# Patient Record
Sex: Female | Born: 2000 | Race: White | Hispanic: No | Marital: Single | Smoking: Never smoker
Health system: Southern US, Community
[De-identification: ages and names within clinical notes are randomized; demographics above are authoritative.]

---
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
## 2020-02-28 ENCOUNTER — Ambulatory Visit: Payer: No Typology Code available for payment source | Admitting: Pediatrics

## 2020-09-23 ENCOUNTER — Other Ambulatory Visit: Payer: Self-pay | Admitting: Pediatrics

## 2020-09-23 DIAGNOSIS — N926 Irregular menstruation, unspecified: Secondary | ICD-10-CM

## 2020-11-07 ENCOUNTER — Other Ambulatory Visit (HOSPITAL_COMMUNITY): Payer: Self-pay | Admitting: Physician Assistant

## 2020-11-07 MED FILL — DOXYCYCLINE HYCLATE 100 MG: 100 | 10 days supply | Qty: 20 | Fill #0

## 2020-11-19 ENCOUNTER — Other Ambulatory Visit (HOSPITAL_COMMUNITY): Payer: Self-pay | Admitting: Physician Assistant

## 2020-11-19 MED FILL — FLUCONAZOLE 200 MG TABLET: 200 | 14 days supply | Qty: 14 | Fill #0

## 2020-11-19 MED FILL — OMEPRAZOLE 40 MG CPDR: 40 | 30 days supply | Qty: 30 | Fill #0

## 2022-03-15 ENCOUNTER — Other Ambulatory Visit (HOSPITAL_COMMUNITY): Payer: Self-pay

## 2022-03-15 MED ORDER — WEGOVY 0.5 MG/0.5ML ~~LOC~~ SOAJ
0.5000 mg | SUBCUTANEOUS | 4 refills | Status: AC
  Filled 2022-03-15 – 2022-03-19 (×2): qty 2, 28d supply, fill #0
  Filled 2022-04-20: qty 2, 28d supply, fill #1

## 2022-03-19 ENCOUNTER — Other Ambulatory Visit (HOSPITAL_COMMUNITY): Payer: Self-pay

## 2022-04-21 ENCOUNTER — Other Ambulatory Visit (HOSPITAL_COMMUNITY): Payer: Self-pay

## 2022-04-22 ENCOUNTER — Other Ambulatory Visit (HOSPITAL_COMMUNITY): Payer: Self-pay

## 2022-04-22 MED ORDER — WEGOVY 1.7 MG/0.75ML ~~LOC~~ SOAJ
1.7000 mg | SUBCUTANEOUS | 0 refills | Status: AC
  Filled 2022-04-22: qty 3, 28d supply, fill #0

## 2022-04-23 ENCOUNTER — Other Ambulatory Visit (HOSPITAL_COMMUNITY): Payer: Self-pay

## 2022-05-26 ENCOUNTER — Other Ambulatory Visit (HOSPITAL_COMMUNITY): Payer: Self-pay

## 2022-05-26 MED ORDER — WEGOVY 2.4 MG/0.75ML ~~LOC~~ SOAJ
2.4000 mg | SUBCUTANEOUS | 6 refills | Status: DC
  Filled 2022-05-26: qty 3, 28d supply, fill #0
  Filled 2022-06-10 – 2022-06-23 (×2): qty 3, 28d supply, fill #1
  Filled 2022-07-21: qty 3, 28d supply, fill #2
  Filled 2022-08-17: qty 3, 28d supply, fill #3
  Filled 2022-09-12: qty 3, 28d supply, fill #4
  Filled 2022-10-11: qty 3, 28d supply, fill #5
  Filled 2022-11-20 – 2022-11-29 (×3): qty 3, 28d supply, fill #6

## 2022-05-27 ENCOUNTER — Other Ambulatory Visit (HOSPITAL_COMMUNITY): Payer: Self-pay

## 2022-06-10 ENCOUNTER — Other Ambulatory Visit (HOSPITAL_COMMUNITY): Payer: Self-pay

## 2022-06-23 ENCOUNTER — Other Ambulatory Visit (HOSPITAL_COMMUNITY): Payer: Self-pay

## 2022-07-21 ENCOUNTER — Other Ambulatory Visit (HOSPITAL_COMMUNITY): Payer: Self-pay

## 2022-08-17 ENCOUNTER — Other Ambulatory Visit (HOSPITAL_COMMUNITY): Payer: Self-pay

## 2022-09-13 ENCOUNTER — Other Ambulatory Visit (HOSPITAL_COMMUNITY): Payer: Self-pay

## 2022-10-11 ENCOUNTER — Other Ambulatory Visit (HOSPITAL_COMMUNITY): Payer: Self-pay

## 2022-10-12 ENCOUNTER — Other Ambulatory Visit (HOSPITAL_COMMUNITY): Payer: Self-pay

## 2022-10-12 MED ORDER — METFORMIN HCL ER 500 MG PO TB24
500.0000 mg | ORAL_TABLET | Freq: Every day | ORAL | 3 refills | Status: DC
  Filled 2022-10-12 – 2023-04-19 (×2): qty 90, 90d supply, fill #0
  Filled 2023-08-07: qty 90, 90d supply, fill #1

## 2022-10-13 ENCOUNTER — Other Ambulatory Visit (HOSPITAL_COMMUNITY): Payer: Self-pay

## 2022-10-25 ENCOUNTER — Other Ambulatory Visit (HOSPITAL_COMMUNITY): Payer: Self-pay

## 2022-11-01 ENCOUNTER — Other Ambulatory Visit (HOSPITAL_COMMUNITY): Payer: Self-pay

## 2022-11-23 ENCOUNTER — Other Ambulatory Visit (HOSPITAL_COMMUNITY): Payer: Self-pay

## 2022-11-25 ENCOUNTER — Other Ambulatory Visit (HOSPITAL_COMMUNITY): Payer: Self-pay

## 2022-11-29 ENCOUNTER — Other Ambulatory Visit (HOSPITAL_COMMUNITY): Payer: Self-pay

## 2022-11-30 ENCOUNTER — Other Ambulatory Visit (HOSPITAL_COMMUNITY): Payer: Self-pay

## 2022-12-23 ENCOUNTER — Other Ambulatory Visit (HOSPITAL_COMMUNITY): Payer: Self-pay

## 2022-12-23 MED ORDER — SEMAGLUTIDE-WEIGHT MANAGEMENT 2.4 MG/0.75ML ~~LOC~~ SOAJ
2.4000 mg | SUBCUTANEOUS | 6 refills | Status: AC
  Filled 2022-12-23: qty 3, 28d supply, fill #0

## 2023-01-20 ENCOUNTER — Other Ambulatory Visit (HOSPITAL_COMMUNITY): Payer: Self-pay

## 2023-01-20 MED ORDER — WEGOVY 2.4 MG/0.75ML ~~LOC~~ SOAJ
2.4000 mg | SUBCUTANEOUS | 4 refills | Status: AC
  Filled 2023-01-20: qty 9, 84d supply, fill #0

## 2023-03-17 DIAGNOSIS — E7849 Other hyperlipidemia: Secondary | ICD-10-CM | POA: Diagnosis not present

## 2023-03-17 DIAGNOSIS — K209 Esophagitis, unspecified without bleeding: Secondary | ICD-10-CM | POA: Diagnosis not present

## 2023-03-17 DIAGNOSIS — R7989 Other specified abnormal findings of blood chemistry: Secondary | ICD-10-CM | POA: Diagnosis not present

## 2023-03-17 DIAGNOSIS — Z131 Encounter for screening for diabetes mellitus: Secondary | ICD-10-CM | POA: Diagnosis not present

## 2023-03-22 ENCOUNTER — Other Ambulatory Visit (HOSPITAL_COMMUNITY): Payer: Self-pay

## 2023-03-22 DIAGNOSIS — Z6841 Body Mass Index (BMI) 40.0 and over, adult: Secondary | ICD-10-CM | POA: Diagnosis not present

## 2023-03-22 DIAGNOSIS — R030 Elevated blood-pressure reading, without diagnosis of hypertension: Secondary | ICD-10-CM | POA: Diagnosis not present

## 2023-03-22 DIAGNOSIS — E282 Polycystic ovarian syndrome: Secondary | ICD-10-CM | POA: Diagnosis not present

## 2023-03-22 DIAGNOSIS — M25473 Effusion, unspecified ankle: Secondary | ICD-10-CM | POA: Diagnosis not present

## 2023-03-22 DIAGNOSIS — E7849 Other hyperlipidemia: Secondary | ICD-10-CM | POA: Diagnosis not present

## 2023-03-22 DIAGNOSIS — Z23 Encounter for immunization: Secondary | ICD-10-CM | POA: Diagnosis not present

## 2023-03-22 DIAGNOSIS — G43909 Migraine, unspecified, not intractable, without status migrainosus: Secondary | ICD-10-CM | POA: Diagnosis not present

## 2023-03-22 DIAGNOSIS — R7989 Other specified abnormal findings of blood chemistry: Secondary | ICD-10-CM | POA: Diagnosis not present

## 2023-03-22 MED ORDER — TOPIRAMATE 50 MG PO TABS
50.0000 mg | ORAL_TABLET | Freq: Every day | ORAL | 12 refills | Status: DC
  Filled 2023-03-22: qty 30, 30d supply, fill #0
  Filled 2023-04-20: qty 30, 30d supply, fill #1
  Filled 2023-05-29: qty 30, 30d supply, fill #2
  Filled 2023-07-07: qty 30, 30d supply, fill #3
  Filled 2023-08-07: qty 30, 30d supply, fill #4

## 2023-04-19 ENCOUNTER — Other Ambulatory Visit (HOSPITAL_COMMUNITY): Payer: Self-pay

## 2023-05-31 ENCOUNTER — Other Ambulatory Visit: Payer: Self-pay | Admitting: Obstetrics and Gynecology

## 2023-05-31 DIAGNOSIS — R030 Elevated blood-pressure reading, without diagnosis of hypertension: Secondary | ICD-10-CM | POA: Diagnosis not present

## 2023-05-31 DIAGNOSIS — Z30431 Encounter for routine checking of intrauterine contraceptive device: Secondary | ICD-10-CM | POA: Diagnosis not present

## 2023-05-31 DIAGNOSIS — Z01419 Encounter for gynecological examination (general) (routine) without abnormal findings: Secondary | ICD-10-CM | POA: Diagnosis not present

## 2023-05-31 DIAGNOSIS — Z113 Encounter for screening for infections with a predominantly sexual mode of transmission: Secondary | ICD-10-CM | POA: Diagnosis not present

## 2023-05-31 DIAGNOSIS — N6310 Unspecified lump in the right breast, unspecified quadrant: Secondary | ICD-10-CM | POA: Diagnosis not present

## 2023-05-31 DIAGNOSIS — Z1389 Encounter for screening for other disorder: Secondary | ICD-10-CM | POA: Diagnosis not present

## 2023-07-08 ENCOUNTER — Other Ambulatory Visit: Payer: Self-pay | Admitting: Oncology

## 2023-07-08 DIAGNOSIS — Z006 Encounter for examination for normal comparison and control in clinical research program: Secondary | ICD-10-CM

## 2023-07-11 ENCOUNTER — Other Ambulatory Visit (HOSPITAL_COMMUNITY)
Admission: RE | Admit: 2023-07-11 | Discharge: 2023-07-11 | Disposition: A | Discharge status: Home / Self Care | Payer: 59 | Source: Ambulatory Visit | Attending: Oncology | Admitting: Oncology

## 2023-07-11 DIAGNOSIS — Z006 Encounter for examination for normal comparison and control in clinical research program: Secondary | ICD-10-CM | POA: Insufficient documentation

## 2023-07-21 LAB — GENECONNECT MOLECULAR SCREEN: Genetic Analysis Overall Interpretation: NEGATIVE

## 2023-08-09 DIAGNOSIS — R7989 Other specified abnormal findings of blood chemistry: Secondary | ICD-10-CM | POA: Diagnosis not present

## 2023-08-09 DIAGNOSIS — E7849 Other hyperlipidemia: Secondary | ICD-10-CM | POA: Diagnosis not present

## 2023-08-09 DIAGNOSIS — Z131 Encounter for screening for diabetes mellitus: Secondary | ICD-10-CM | POA: Diagnosis not present

## 2023-08-09 DIAGNOSIS — L03115 Cellulitis of right lower limb: Secondary | ICD-10-CM | POA: Diagnosis not present

## 2023-08-22 ENCOUNTER — Other Ambulatory Visit: Payer: 59

## 2023-09-02 ENCOUNTER — Other Ambulatory Visit (HOSPITAL_COMMUNITY): Payer: Self-pay

## 2023-09-05 ENCOUNTER — Other Ambulatory Visit (HOSPITAL_COMMUNITY): Payer: Self-pay

## 2023-09-05 MED ORDER — TOPIRAMATE 100 MG PO TABS
100.0000 mg | ORAL_TABLET | Freq: Every day | ORAL | 11 refills | Status: DC
  Filled 2023-09-05: qty 30, 30d supply, fill #0
  Filled 2023-10-16: qty 30, 30d supply, fill #1
  Filled 2023-11-12: qty 30, 30d supply, fill #2
  Filled 2024-01-15: qty 30, 30d supply, fill #3
  Filled 2024-02-21: qty 30, 30d supply, fill #4
  Filled 2024-03-27: qty 30, 30d supply, fill #5
  Filled 2024-05-21: qty 30, 30d supply, fill #6
  Filled 2024-07-08: qty 30, 30d supply, fill #7
  Filled 2024-08-26: qty 30, 30d supply, fill #8

## 2023-09-15 ENCOUNTER — Other Ambulatory Visit (HOSPITAL_COMMUNITY): Payer: Self-pay

## 2023-11-12 ENCOUNTER — Other Ambulatory Visit (HOSPITAL_COMMUNITY): Payer: Self-pay

## 2023-11-14 ENCOUNTER — Other Ambulatory Visit: Payer: Self-pay

## 2023-11-15 ENCOUNTER — Other Ambulatory Visit (HOSPITAL_COMMUNITY): Payer: Self-pay

## 2023-11-15 ENCOUNTER — Encounter (HOSPITAL_COMMUNITY): Payer: Self-pay

## 2023-11-24 ENCOUNTER — Other Ambulatory Visit (HOSPITAL_COMMUNITY): Payer: Self-pay

## 2023-11-25 ENCOUNTER — Other Ambulatory Visit (HOSPITAL_COMMUNITY): Payer: Self-pay

## 2023-11-28 ENCOUNTER — Other Ambulatory Visit (HOSPITAL_COMMUNITY): Payer: Self-pay

## 2023-12-02 ENCOUNTER — Other Ambulatory Visit (HOSPITAL_COMMUNITY): Payer: Self-pay

## 2023-12-12 ENCOUNTER — Other Ambulatory Visit (HOSPITAL_COMMUNITY): Payer: Self-pay

## 2023-12-12 MED ORDER — METFORMIN HCL ER 500 MG PO TB24
500.0000 mg | ORAL_TABLET | Freq: Every day | ORAL | 3 refills | Status: AC
  Filled 2023-12-12: qty 90, 90d supply, fill #0

## 2023-12-13 ENCOUNTER — Other Ambulatory Visit (HOSPITAL_COMMUNITY): Payer: Self-pay

## 2023-12-14 ENCOUNTER — Other Ambulatory Visit (HOSPITAL_COMMUNITY): Payer: Self-pay

## 2023-12-14 MED ORDER — METFORMIN HCL ER 500 MG PO TB24
500.0000 mg | ORAL_TABLET | Freq: Every day | ORAL | 3 refills | Status: AC
  Filled 2024-02-21 – 2024-02-24 (×2): qty 90, 90d supply, fill #0

## 2024-02-20 DIAGNOSIS — E7849 Other hyperlipidemia: Secondary | ICD-10-CM | POA: Diagnosis not present

## 2024-02-20 DIAGNOSIS — R7989 Other specified abnormal findings of blood chemistry: Secondary | ICD-10-CM | POA: Diagnosis not present

## 2024-02-20 DIAGNOSIS — E782 Mixed hyperlipidemia: Secondary | ICD-10-CM | POA: Diagnosis not present

## 2024-02-20 DIAGNOSIS — D559 Anemia due to enzyme disorder, unspecified: Secondary | ICD-10-CM | POA: Diagnosis not present

## 2024-02-20 DIAGNOSIS — Z131 Encounter for screening for diabetes mellitus: Secondary | ICD-10-CM | POA: Diagnosis not present

## 2024-02-20 DIAGNOSIS — Z13220 Encounter for screening for lipoid disorders: Secondary | ICD-10-CM | POA: Diagnosis not present

## 2024-02-22 ENCOUNTER — Other Ambulatory Visit: Payer: Self-pay

## 2024-02-22 ENCOUNTER — Other Ambulatory Visit (HOSPITAL_COMMUNITY): Payer: Self-pay

## 2024-02-24 ENCOUNTER — Other Ambulatory Visit (HOSPITAL_COMMUNITY): Payer: Self-pay

## 2024-02-28 ENCOUNTER — Other Ambulatory Visit (HOSPITAL_COMMUNITY): Payer: Self-pay

## 2024-02-28 DIAGNOSIS — R7989 Other specified abnormal findings of blood chemistry: Secondary | ICD-10-CM | POA: Diagnosis not present

## 2024-02-28 DIAGNOSIS — E282 Polycystic ovarian syndrome: Secondary | ICD-10-CM | POA: Diagnosis not present

## 2024-02-28 DIAGNOSIS — G43909 Migraine, unspecified, not intractable, without status migrainosus: Secondary | ICD-10-CM | POA: Diagnosis not present

## 2024-02-28 MED ORDER — CETIRIZINE HCL 10 MG PO TABS
10.0000 mg | ORAL_TABLET | Freq: Every day | ORAL | 6 refills | Status: AC
  Filled 2024-02-28: qty 100, 100d supply, fill #0

## 2024-02-28 MED ORDER — METFORMIN HCL ER 500 MG PO TB24
1000.0000 mg | ORAL_TABLET | Freq: Every day | ORAL | 3 refills | Status: AC
  Filled 2024-05-21: qty 180, 90d supply, fill #0
  Filled 2024-08-26: qty 180, 90d supply, fill #1

## 2024-02-28 MED ORDER — ERGOCALCIFEROL 1.25 MG (50000 UT) PO CAPS
50000.0000 [IU] | ORAL_CAPSULE | ORAL | 2 refills | Status: AC
  Filled 2024-02-28: qty 13, 90d supply, fill #0
  Filled 2024-05-21: qty 13, 90d supply, fill #1
  Filled 2024-07-08 – 2024-08-26 (×2): qty 13, 90d supply, fill #2

## 2024-03-01 ENCOUNTER — Telehealth: Admitting: Family Medicine

## 2024-03-01 DIAGNOSIS — H10022 Other mucopurulent conjunctivitis, left eye: Secondary | ICD-10-CM | POA: Diagnosis not present

## 2024-03-01 MED ORDER — POLYMYXIN B-TRIMETHOPRIM 10000-0.1 UNIT/ML-% OP SOLN: 1.0000 [drp] | Freq: Four times a day (QID) | OPHTHALMIC | 0 refills | Status: AC

## 2024-03-01 NOTE — Progress Notes (Signed)
 E-Visit for Pink Eye ? ? ?We are sorry that you are not feeling well.  Here is how we plan to help! ? ?Based on what you have shared with me it looks like you have conjunctivitis.  Conjunctivitis is a common inflammatory or infectious condition of the eye that is often referred to as "pink eye".  In most cases it is contagious (viral or bacterial). However, not all conjunctivitis requires antibiotics (ex. Allergic).  We have made appropriate suggestions for you based upon your presentation. ? ?I have prescribed Polytrim Ophthalmic drops 1-2 drops 4 times a day times 5 days ? ?Pink eye can be highly contagious.  It is typically spread through direct contact with secretions, or contaminated objects or surfaces that one may have touched.  Strict handwashing is suggested with soap and water is urged.  If not available, use alcohol based had sanitizer.  Avoid unnecessary touching of the eye.  If you wear contact lenses, you will need to refrain from wearing them until you see no white discharge from the eye for at least 24 hours after being on medication.  You should see symptom improvement in 1-2 days after starting the medication regimen.  Call us if symptoms are not improved in 1-2 days. ? ?Home Care: ?Wash your hands often! ?Do not wear your contacts until you complete your treatment plan. ?Avoid sharing towels, bed linen, personal items with a person who has pink eye. ?See attention for anyone in your home with similar symptoms. ? ?Get Help Right Away If: ?Your symptoms do not improve. ?You develop blurred or loss of vision. ?Your symptoms worsen (increased discharge, pain or redness) ? ? ?Thank you for choosing an e-visit. ? ?Your e-visit answers were reviewed by a board certified advanced clinical practitioner to complete your personal care plan. Depending upon the condition, your plan could have included both over the counter or prescription medications. ? ?Please review your pharmacy choice. Make sure the  pharmacy is open so you can pick up prescription now. If there is a problem, you may contact your provider through [employer] and have the prescription routed to another pharmacy.  Your safety is important to [place]. If you have drug allergies check your prescription carefully.  ? ?For the next 24 hours you can use MyChart to ask questions about today's visit, request a non-urgent call back, or ask for a work or school excuse. ?You will get an email in the next two days asking about your experience. I hope that your e-visit has been valuable and will speed your recovery. ? ?I provided 5 minutes of non face-to-face time during this encounter for chart review, medication and order placement, as well as and documentation.  ? ?

## 2024-03-02 ENCOUNTER — Other Ambulatory Visit (HOSPITAL_COMMUNITY): Payer: Self-pay

## 2024-03-09 ENCOUNTER — Other Ambulatory Visit (HOSPITAL_COMMUNITY): Payer: Self-pay

## 2024-03-16 ENCOUNTER — Other Ambulatory Visit (HOSPITAL_COMMUNITY): Payer: Self-pay

## 2024-03-22 ENCOUNTER — Other Ambulatory Visit (HOSPITAL_COMMUNITY): Payer: Self-pay

## 2024-03-29 ENCOUNTER — Other Ambulatory Visit (HOSPITAL_COMMUNITY): Payer: Self-pay

## 2024-04-02 ENCOUNTER — Other Ambulatory Visit (HOSPITAL_COMMUNITY): Payer: Self-pay

## 2024-05-22 ENCOUNTER — Other Ambulatory Visit (HOSPITAL_COMMUNITY): Payer: Self-pay

## 2024-06-06 ENCOUNTER — Other Ambulatory Visit: Payer: Self-pay | Admitting: Obstetrics and Gynecology

## 2024-06-06 DIAGNOSIS — N6310 Unspecified lump in the right breast, unspecified quadrant: Secondary | ICD-10-CM

## 2024-06-19 ENCOUNTER — Ambulatory Visit
Admission: RE | Admit: 2024-06-19 | Discharge: 2024-06-19 | Disposition: A | Discharge status: Home / Self Care | Source: Ambulatory Visit | Attending: Obstetrics and Gynecology | Admitting: Obstetrics and Gynecology

## 2024-06-19 DIAGNOSIS — N6310 Unspecified lump in the right breast, unspecified quadrant: Secondary | ICD-10-CM

## 2024-07-09 ENCOUNTER — Other Ambulatory Visit (HOSPITAL_COMMUNITY): Payer: Self-pay

## 2024-09-03 ENCOUNTER — Other Ambulatory Visit (HOSPITAL_COMMUNITY): Payer: Self-pay

## 2024-09-03 MED ORDER — METFORMIN HCL 500 MG PO TABS
500.0000 mg | ORAL_TABLET | Freq: Every day | ORAL | 2 refills | Status: AC
  Filled 2024-09-03 (×2): qty 30, 30d supply, fill #0

## 2024-09-03 MED ORDER — HYDROXYZINE HCL 10 MG PO TABS
10.0000 mg | ORAL_TABLET | Freq: Two times a day (BID) | ORAL | 0 refills | Status: DC | PRN
  Filled 2024-09-03 (×2): qty 60, 30d supply, fill #0

## 2024-09-04 ENCOUNTER — Other Ambulatory Visit: Payer: Self-pay

## 2024-09-30 ENCOUNTER — Other Ambulatory Visit (HOSPITAL_BASED_OUTPATIENT_CLINIC_OR_DEPARTMENT_OTHER): Payer: Self-pay

## 2024-10-01 ENCOUNTER — Other Ambulatory Visit (HOSPITAL_BASED_OUTPATIENT_CLINIC_OR_DEPARTMENT_OTHER): Payer: Self-pay

## 2024-10-01 MED ORDER — TOPIRAMATE 100 MG PO TABS
100.0000 mg | ORAL_TABLET | Freq: Every day | ORAL | 11 refills | Status: AC
  Filled 2024-10-01: qty 30, 30d supply, fill #0

## 2024-10-02 ENCOUNTER — Other Ambulatory Visit (HOSPITAL_COMMUNITY): Payer: Self-pay

## 2024-10-02 ENCOUNTER — Other Ambulatory Visit (HOSPITAL_BASED_OUTPATIENT_CLINIC_OR_DEPARTMENT_OTHER): Payer: Self-pay

## 2024-10-02 MED ORDER — HYDROXYZINE HCL 10 MG PO TABS
10.0000 mg | ORAL_TABLET | Freq: Two times a day (BID) | ORAL | 1 refills | Status: AC | PRN
  Filled 2024-10-02 (×2): qty 60, 30d supply, fill #0

## 2024-10-02 MED ORDER — TOPIRAMATE 100 MG PO TABS
100.0000 mg | ORAL_TABLET | Freq: Every day | ORAL | 11 refills | Status: AC
  Filled 2024-10-02 – 2024-11-20 (×2): qty 30, 30d supply, fill #0

## 2024-10-12 ENCOUNTER — Other Ambulatory Visit (HOSPITAL_BASED_OUTPATIENT_CLINIC_OR_DEPARTMENT_OTHER): Payer: Self-pay

## 2024-10-12 MED ORDER — HYDROXYZINE HCL 10 MG PO TABS: 10.0000 mg | ORAL_TABLET | Freq: Two times a day (BID) | ORAL | 0 refills | Status: AC

## 2024-11-20 ENCOUNTER — Other Ambulatory Visit (HOSPITAL_BASED_OUTPATIENT_CLINIC_OR_DEPARTMENT_OTHER): Payer: Self-pay

## 2024-11-24 ENCOUNTER — Encounter
